# Patient Record
Sex: Female | Born: 1965 | Race: Black or African American | Hispanic: No | Marital: Single | Smoking: Never smoker
Health system: Southern US, Community
[De-identification: ages and names within clinical notes are randomized; demographics above are authoritative.]

## PROBLEM LIST (undated history)

## (undated) DIAGNOSIS — R42 Dizziness and giddiness: Secondary | ICD-10-CM

## (undated) DIAGNOSIS — I10 Essential (primary) hypertension: Secondary | ICD-10-CM

## (undated) HISTORY — PX: TUBAL LIGATION: SHX77

## (undated) HISTORY — PX: ABDOMINAL HYSTERECTOMY: SHX81

---

## 1999-06-22 ENCOUNTER — Emergency Department (HOSPITAL_COMMUNITY): Admission: EM | Admit: 1999-06-22 | Discharge: 1999-06-22 | Payer: Self-pay | Admitting: Emergency Medicine

## 1999-09-13 ENCOUNTER — Other Ambulatory Visit: Admission: RE | Admit: 1999-09-13 | Discharge: 1999-09-13 | Payer: Self-pay | Admitting: Obstetrics

## 2015-08-18 ENCOUNTER — Encounter (HOSPITAL_BASED_OUTPATIENT_CLINIC_OR_DEPARTMENT_OTHER): Payer: Self-pay | Admitting: Emergency Medicine

## 2015-08-18 ENCOUNTER — Emergency Department (HOSPITAL_BASED_OUTPATIENT_CLINIC_OR_DEPARTMENT_OTHER): Payer: Self-pay

## 2015-08-18 ENCOUNTER — Other Ambulatory Visit: Payer: Self-pay

## 2015-08-18 DIAGNOSIS — Z79899 Other long term (current) drug therapy: Secondary | ICD-10-CM | POA: Insufficient documentation

## 2015-08-18 DIAGNOSIS — R0789 Other chest pain: Secondary | ICD-10-CM | POA: Insufficient documentation

## 2015-08-18 DIAGNOSIS — J301 Allergic rhinitis due to pollen: Secondary | ICD-10-CM | POA: Insufficient documentation

## 2015-08-18 DIAGNOSIS — I10 Essential (primary) hypertension: Secondary | ICD-10-CM | POA: Insufficient documentation

## 2015-08-18 LAB — BASIC METABOLIC PANEL
ANION GAP: 8 (ref 5–15)
BUN: 19 mg/dL (ref 6–20)
CALCIUM: 9.2 mg/dL (ref 8.9–10.3)
CO2: 27 mmol/L (ref 22–32)
Chloride: 105 mmol/L (ref 101–111)
Creatinine, Ser: 0.77 mg/dL (ref 0.44–1.00)
Glucose, Bld: 106 mg/dL — ABNORMAL HIGH (ref 65–99)
Potassium: 3.4 mmol/L — ABNORMAL LOW (ref 3.5–5.1)
Sodium: 140 mmol/L (ref 135–145)

## 2015-08-18 LAB — CBC
HCT: 37.3 % (ref 36.0–46.0)
HEMOGLOBIN: 12.1 g/dL (ref 12.0–15.0)
MCH: 27.9 pg (ref 26.0–34.0)
MCHC: 32.4 g/dL (ref 30.0–36.0)
MCV: 85.9 fL (ref 78.0–100.0)
Platelets: 249 10*3/uL (ref 150–400)
RBC: 4.34 MIL/uL (ref 3.87–5.11)
RDW: 12.9 % (ref 11.5–15.5)
WBC: 4 10*3/uL (ref 4.0–10.5)

## 2015-08-18 NOTE — ED Notes (Signed)
Patient states that she has generalized chest pain to her right and left chest about 2 hours ago. She reports that it radiated to her neck and shoulders. Felt like it was sharp like pins and needles. Patient denies any SOB, or N/V or dizziness. Patient reports that the pain has now lessened from when it first happened and she now has a HA

## 2015-08-19 ENCOUNTER — Emergency Department (HOSPITAL_BASED_OUTPATIENT_CLINIC_OR_DEPARTMENT_OTHER)
Admission: EM | Admit: 2015-08-19 | Discharge: 2015-08-19 | Disposition: A | Discharge status: Home / Self Care | Payer: Self-pay | Attending: Emergency Medicine | Admitting: Emergency Medicine

## 2015-08-19 DIAGNOSIS — R0789 Other chest pain: Secondary | ICD-10-CM

## 2015-08-19 DIAGNOSIS — J302 Other seasonal allergic rhinitis: Secondary | ICD-10-CM

## 2015-08-19 HISTORY — DX: Dizziness and giddiness: R42

## 2015-08-19 HISTORY — DX: Essential (primary) hypertension: I10

## 2015-08-19 LAB — URINALYSIS, ROUTINE W REFLEX MICROSCOPIC
BILIRUBIN URINE: NEGATIVE
Glucose, UA: NEGATIVE mg/dL
Hgb urine dipstick: NEGATIVE
KETONES UR: NEGATIVE mg/dL
Leukocytes, UA: NEGATIVE
NITRITE: NEGATIVE
PH: 7 (ref 5.0–8.0)
PROTEIN: NEGATIVE mg/dL
Specific Gravity, Urine: 1.029 (ref 1.005–1.030)

## 2015-08-19 LAB — TROPONIN I

## 2015-08-19 MED ORDER — KETOROLAC TROMETHAMINE 30 MG/ML IJ SOLN
30.0000 mg | Freq: Once | INTRAMUSCULAR | Status: DC
  Filled 2015-08-19: qty 1

## 2015-08-19 MED ORDER — LORATADINE 10 MG PO TABS: 10.0000 mg | ORAL_TABLET | Freq: Every day | ORAL | Status: AC

## 2015-08-19 MED ORDER — NAPROXEN 500 MG PO TABS: 500.0000 mg | ORAL_TABLET | Freq: Two times a day (BID) | ORAL | Status: AC

## 2015-08-19 NOTE — ED Provider Notes (Signed)
CSN: [id number]     Arrival date & time 08/18/15  2200 History   First MD Initiated Contact with Patient 08/19/15 0143     Chief Complaint  Patient presents with  . Chest Pain     (Consider location/radiation/quality/duration/timing/severity/associated sxs/prior Treatment) HPI  This is a 50 year old female with a history of hypertension who presents with chest, neck, and shoulder pain. Patient had onset of symptoms one hour prior to arrival. She states that the pain was sharp and right-sided with a sensation of "pins and needles." Nothing seemed to make it better or worse. Denies associated shortness of breath, nausea, vomiting. No association with eating. 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 Currently she reports the pain is resolved. She does report headache which she states she sometimes gets and a one-month history of sore throat and cough. She reports itchy watery eyes and sneezing. History of seasonal allergies. No fevers.   Past Medical History  Diagnosis Date  . Vertigo   . Hypertension    Past Surgical History  Procedure Laterality Date  . Tubal ligation    . Abdominal hysterectomy     History reviewed. No pertinent family history. Social History  Substance Use Topics  . Smoking status: Never Smoker   . Smokeless tobacco: None  . Alcohol Use: Yes     Comment: occ   OB History    No data available     Review of Systems  Constitutional: Negative for fever.  HENT: Positive for sneezing and sore throat.   Respiratory: Negative for shortness of breath.   Cardiovascular: Positive for chest pain. Negative for leg swelling.  Gastrointestinal: Negative for nausea, vomiting, abdominal pain and diarrhea.  All other systems reviewed and are negative.     Allergies  Latex  Home Medications   Prior to Admission medications   Medication Sig Start  Date End Date Taking? Authorizing Provider  lisinopril-hydrochlorothiazide (PRINZIDE,ZESTORETIC) 10-12.5 MG tablet Take 1 tablet by mouth daily.   Yes Historical Provider, MD  loratadine (CLARITIN) 10 MG tablet Take 1 tablet (10 mg total) by mouth daily. 08/19/15   Shon Batonourtney F Horton, MD   BP 132/99 mmHg  Pulse 81  Temp(Src) 99.1 F (37.3 C) (Oral)  Resp 12  Ht 5' (1.524 m)  Wt 150 lb (68.04 kg)  BMI 29.30 kg/m2  SpO2 100% Physical Exam  Constitutional: She is oriented to person, place, and time. She appears well-developed and well-nourished. No distress.  HENT:  Head: Normocephalic and atraumatic.  Mouth/Throat: Oropharynx is clear and moist.  Postnasal drip noted  Eyes: Pupils are equal, round, and reactive to light.  Neck: Neck supple.  Cardiovascular: Normal rate, regular rhythm and normal heart sounds.   Pulmonary/Chest: Effort normal and breath sounds normal. No respiratory distress. She has no wheezes. She exhibits tenderness.  Tenderness palpation over the anterior chest wall diffusely  Abdominal: Soft. Bowel sounds are normal. There is no tenderness. There is no rebound.  Musculoskeletal: She exhibits no edema.  Neurological: She is alert and oriented to person, place, and time.  Skin: Skin is warm and dry.  Psychiatric: She has a normal mood and affect.  Nursing note and vitals reviewed.   ED Course  Procedures (including critical care time) Labs Review Labs Reviewed  BASIC METABOLIC PANEL - Abnormal; Notable for the following:    Potassium 3.4 (*)  Glucose, Bld 106 (*)    All other components within normal limits  CBC  TROPONIN I  URINALYSIS, ROUTINE W REFLEX MICROSCOPIC (NOT AT [facility])  TROPONIN I    Imaging Review Dg Chest 2 View  08/18/2015  CLINICAL DATA:  Generalized chest pain for 2 hours. EXAM: CHEST  2 VIEW COMPARISON:  None. FINDINGS: The cardiomediastinal contours are normal. The lungs are clear. Pulmonary vasculature is normal. No consolidation,  pleural effusion, or pneumothorax. No acute osseous abnormalities are seen. IMPRESSION: No acute pulmonary process. Electronically Signed   By: Rubye Oaks M.D.   On: 08/18/2015 22:47   I have personally reviewed and evaluated these images and lab results as part of my medical decision-making.   EKG Interpretation   Date/Time:  Saturday August 18 2015 22:08:44 EST Ventricular Rate:  100 PR Interval:  162 QRS Duration: 80 QT Interval:  388 QTC Calculation: 500 R Axis:   31 Text Interpretation:  Normal sinus rhythm Possible Left atrial enlargement  Prolonged QT Abnormal ECG Confirmed by HORTON  MD, COURTNEY ([id number]) on  08/19/2015 1:11:56 AM      MDM   Final diagnoses:  Other chest pain  Seasonal allergies    Patient presents with chest pain. Nontoxic-appearing. Vital signs reassuring.  Pain is atypical for ACS. EKG is reassuring. Chest x-ray is negative. Troponin 2 is negative. Patient is relatively low risk for ACS with only risk factor of hypertension. Chest pain is reproducible on exam. 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 Patient also reports cough and sore throat. This appears more chronic in nature. Her physical exam is unremarkable. Suspect seasonal allergies. Discussed with patient follow-up with cardiology when necessary for evaluation for possible stress testing. Will discharge home with Claritin and anti-inflammatory medications.  After history, exam, and medical workup I feel the patient has been appropriately medically screened and is safe for discharge home. Pertinent diagnoses were discussed with the patient. Patient was given return precautions.   Shon Baton, MD 08/19/15 [id number]

## 2015-08-19 NOTE — ED Notes (Signed)
Attempted to put pt on monitor, but refused at the time because she had to go to the bathroom.

## 2015-08-19 NOTE — Discharge Instructions (Signed)
Allergies An allergy is an abnormal reaction to a substance by the body's defense system (immune system). Allergies can develop at any age. WHAT CAUSES ALLERGIES? An allergic reaction happens when the immune system mistakenly reacts to a normally harmless substance, called an allergen, as if it were harmful. The immune system releases antibodies to fight the substance. Antibodies eventually release a chemical called histamine into the bloodstream. The release of histamine is meant to protect the body from infection, but it also causes discomfort. An allergic reaction can be triggered by:  Eating an allergen.  Inhaling an allergen.  Touching an allergen. WHAT TYPES OF ALLERGIES ARE THERE? There are many types of allergies. Common types include:  Seasonal allergies. People with this type of allergy are usually allergic to substances that are only present during certain seasons, such as molds and pollens.  Food allergies.  Drug allergies.  Insect allergies.  Animal dander allergies. WHAT ARE SYMPTOMS OF ALLERGIES? Possible allergy symptoms include:  Swelling of the lips, face, tongue, mouth, or throat.  Sneezing, coughing, or wheezing.  Nasal congestion.  Tingling in the mouth.  Rash.  Itching.  Itchy, red, swollen areas of skin (hives).  Watery eyes.  Vomiting.  Diarrhea.  Dizziness.  Lightheadedness.  Fainting.  Trouble breathing or swallowing.  Chest tightness.  Rapid heartbeat. HOW ARE ALLERGIES DIAGNOSED? Allergies are diagnosed with a medical and family history and one or more of the following:  Skin tests.  Blood tests.  A food diary. A food diary is a record of all the foods and drinks you have in a day and of all the symptoms you experience.  The results of an elimination diet. An elimination diet involves eliminating foods from your diet and then adding them back in one by one to find out if a certain food causes an allergic reaction. HOW ARE  ALLERGIES TREATED? There is no cure for allergies, but allergic reactions can be treated with medicine. Severe reactions usually need to be treated at a hospital. HOW CAN REACTIONS BE PREVENTED? The best way to prevent an allergic reaction is by avoiding the substance you are allergic to. Allergy shots and medicines can also help prevent reactions in some cases. People with severe allergic reactions may be able to prevent a life-threatening reaction called anaphylaxis with a medicine given right after exposure to the allergen.   This information is not intended to replace advice given to you by your health care provider. Make sure you discuss any questions you have with your health care provider.   Document Released: 08/19/2002 Document Revised: 06/16/2014 Document Reviewed: 03/07/2014 Elsevier Interactive Patient Education 2016 Elsevier Inc.  Chest Wall Pain Chest wall pain is pain in or around the bones and muscles of your chest. Sometimes, an injury causes this pain. Sometimes, the cause may not be known. This pain may take several weeks or longer to get better. HOME CARE INSTRUCTIONS  Pay attention to any changes in your symptoms. Take these actions to help with your pain:   Rest as told by your health care provider.   Avoid activities that cause pain. These include any activities that use your chest muscles or your abdominal and side muscles to lift heavy items.   If directed, apply ice to the painful area:  Put ice in a plastic bag.  Place a towel between your skin and the bag.  Leave the ice on for 20 minutes, 2-3 times per day.  Take over-the-counter and prescription medicines only as told  by your health care provider.  Do not use tobacco products, including cigarettes, chewing tobacco, and e-cigarettes. If you need help quitting, ask your health care provider.  Keep all follow-up visits as told by your health care provider. This is important. SEEK MEDICAL CARE IF:  You  have a fever.  Your chest pain becomes worse.  You have new symptoms. SEEK IMMEDIATE MEDICAL CARE IF:  You have nausea or vomiting.  You feel sweaty or light-headed.  You have a cough with phlegm (sputum) or you cough up blood.  You develop shortness of breath.   This information is not intended to replace advice given to you by your health care provider. Make sure you discuss any questions you have with your health care provider.   Document Released: 05/26/2005 Document Revised: 02/14/2015 Document Reviewed: 08/21/2014 Elsevier Interactive Patient Education [employer] Inc.

## 2017-11-03 IMAGING — CR DG CHEST 2V
2 series · 2 of 2 positions shown · non-contrast
Comparison: None.

CLINICAL DATA: Generalized chest pain for 2 hours.

EXAM:
CHEST  2 VIEW

[w chest pa]
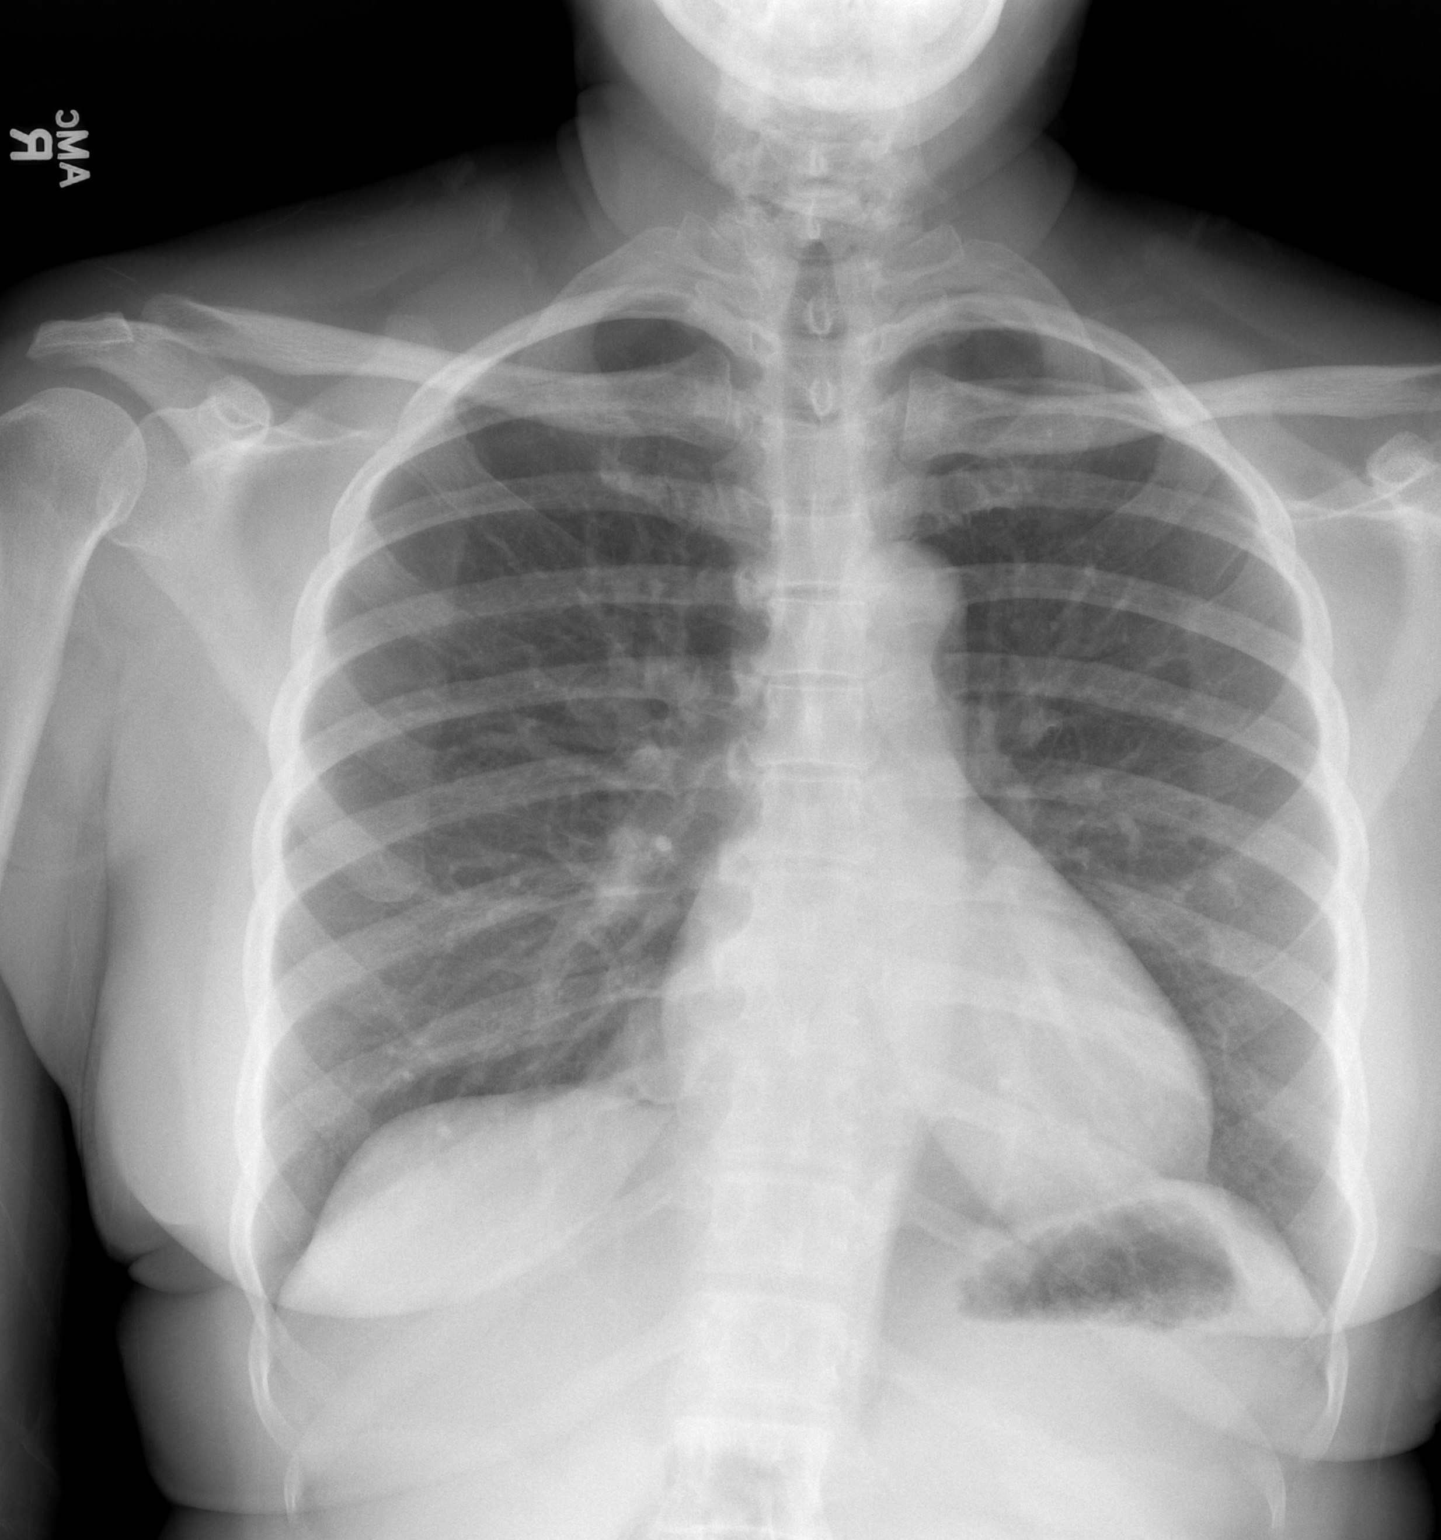

[w chest lat]
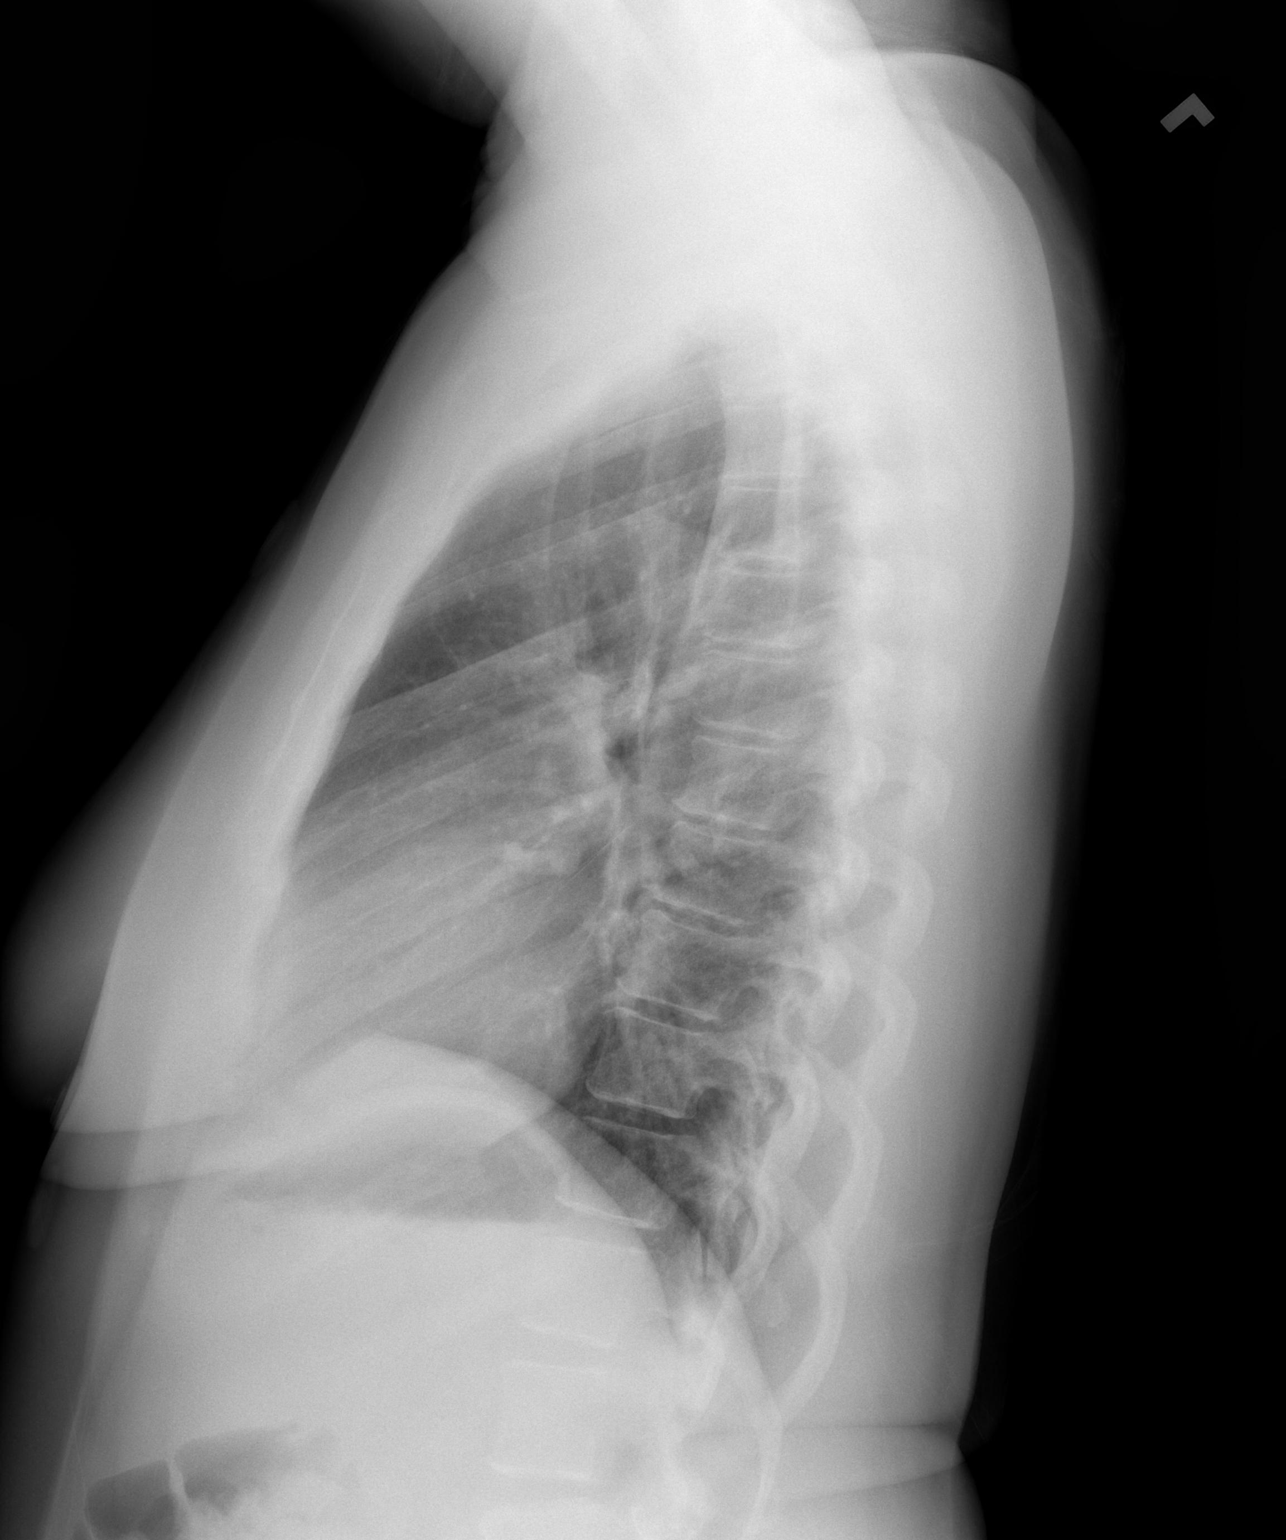

[2 of 2 positions shown; findings below may reference images not displayed]

FINDINGS: The cardiomediastinal contours are normal. The lungs are clear.
Pulmonary vasculature is normal. No consolidation, pleural effusion,
or pneumothorax. No acute osseous abnormalities are seen.
IMPRESSION: No acute pulmonary process.

## 2019-12-21 ENCOUNTER — Other Ambulatory Visit: Payer: Self-pay | Admitting: *Deleted

## 2019-12-21 DIAGNOSIS — Z1231 Encounter for screening mammogram for malignant neoplasm of breast: Secondary | ICD-10-CM

## 2020-01-24 ENCOUNTER — Ambulatory Visit: Payer: Self-pay

## 2022-08-10 ENCOUNTER — Emergency Department (HOSPITAL_BASED_OUTPATIENT_CLINIC_OR_DEPARTMENT_OTHER): Payer: Self-pay

## 2022-08-10 ENCOUNTER — Encounter (HOSPITAL_BASED_OUTPATIENT_CLINIC_OR_DEPARTMENT_OTHER): Payer: Self-pay | Admitting: Emergency Medicine

## 2022-08-10 ENCOUNTER — Emergency Department (HOSPITAL_BASED_OUTPATIENT_CLINIC_OR_DEPARTMENT_OTHER)
Admission: EM | Admit: 2022-08-10 | Discharge: 2022-08-10 | Disposition: A | Discharge status: Home / Self Care | Payer: Self-pay | Attending: Emergency Medicine | Admitting: Emergency Medicine

## 2022-08-10 ENCOUNTER — Other Ambulatory Visit: Payer: Self-pay

## 2022-08-10 DIAGNOSIS — R002 Palpitations: Secondary | ICD-10-CM | POA: Insufficient documentation

## 2022-08-10 DIAGNOSIS — R079 Chest pain, unspecified: Secondary | ICD-10-CM | POA: Insufficient documentation

## 2022-08-10 DIAGNOSIS — I10 Essential (primary) hypertension: Secondary | ICD-10-CM | POA: Insufficient documentation

## 2022-08-10 DIAGNOSIS — Z91040 Latex allergy status: Secondary | ICD-10-CM | POA: Insufficient documentation

## 2022-08-10 DIAGNOSIS — Z79899 Other long term (current) drug therapy: Secondary | ICD-10-CM | POA: Insufficient documentation

## 2022-08-10 LAB — BASIC METABOLIC PANEL
Anion gap: 6 (ref 5–15)
BUN: 14 mg/dL (ref 6–20)
CO2: 30 mmol/L (ref 22–32)
Calcium: 9.3 mg/dL (ref 8.9–10.3)
Chloride: 102 mmol/L (ref 98–111)
Creatinine, Ser: 0.75 mg/dL (ref 0.44–1.00)
GFR, Estimated: >60 mL/min (ref >60)
Glucose, Bld: 107 mg/dL — ABNORMAL HIGH (ref 70–99)
Potassium: 3.6 mmol/L (ref 3.5–5.1)
Sodium: 138 mmol/L (ref 135–145)

## 2022-08-10 LAB — CBC
HCT: 40.2 % (ref 36.0–46.0)
Hemoglobin: 12.8 g/dL (ref 12.0–15.0)
MCH: 27.4 pg (ref 26.0–34.0)
MCHC: 31.8 g/dL (ref 30.0–36.0)
MCV: 86.1 fL (ref 80.0–100.0)
Platelets: 285 10*3/uL (ref 150–400)
RBC: 4.67 MIL/uL (ref 3.87–5.11)
RDW: 12.8 % (ref 11.5–15.5)
WBC: 4.7 10*3/uL (ref 4.0–10.5)
nRBC: 0 % (ref 0.0–0.2)

## 2022-08-10 LAB — D-DIMER, QUANTITATIVE: D-Dimer, Quant: <0.27 ug/mL-FEU (ref 0.00–0.50)

## 2022-08-10 LAB — PREGNANCY, URINE: Preg Test, Ur: NEGATIVE

## 2022-08-10 LAB — T4, FREE: Free T4: 0.78 ng/dL (ref 0.61–1.12)

## 2022-08-10 LAB — TROPONIN I (HIGH SENSITIVITY)
Troponin I (High Sensitivity): 2 ng/L (ref <18)
Troponin I (High Sensitivity): <2 ng/L (ref <18)

## 2022-08-10 LAB — TSH: TSH: 0.637 u[IU]/mL (ref 0.350–4.500)

## 2022-08-10 NOTE — Discharge Instructions (Addendum)
Please follow-up with your primary care doctor, call them tomorrow, and request that you get a heart monitor outpatient to evaluate if you are having arrhythmia patient.  He had any severe chest pain, shortness of breath, weakness on one side of the body, facial droop please return to the ER.

## 2022-08-10 NOTE — ED Triage Notes (Signed)
Pt with chest pressure and heart racing; has had 2 episodes of this in the last 2 wks; sts it happens when she is being active (cleaning, etc); NAD at this time

## 2022-08-10 NOTE — ED Notes (Signed)
ED Provider at bedside. 

## 2022-08-10 NOTE — ED Provider Notes (Signed)
[facility] Provider Note   CSN: [id number] Arrival date & time: 08/10/22  1300     History  Chief Complaint  Patient presents with   Chest Pain    Courtney Campbell is a 57 y.o. female, history of hypertension, who presents to the ED secondary to chest tightness, shortness of breath, that has been episodic going on for the last 2 weeks.  She states 2 to weeks ago, she noticed that she was getting her scrub jacket on, when she all of a sudden had fluttering of her chest, felt like it was beating out of her chest, and some chest pressure, this lasted for about 5 minutes, and then resolved, was associated with shortness of breath.  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 as she was walking from the bathroom, and noticed the same sensation.  Denies any radiation of the pain, no nausea, vomiting, abdominal pain.  Denies any recent trauma.     Home Medications Prior to Admission medications   Medication Sig Start Date End Date Taking? Authorizing Provider  lisinopril-hydrochlorothiazide (PRINZIDE,ZESTORETIC) 10-12.5 MG tablet Take 1 tablet by mouth daily.    [provider]  loratadine (CLARITIN) 10 MG tablet Take 1 tablet (10 mg total) by mouth daily. 08/19/15   Horton, Barbette Hair, MD  naproxen (NAPROSYN) 500 MG tablet Take 1 tablet (500 mg total) by mouth 2 (two) times daily. 08/19/15   Horton, Barbette Hair, MD      Allergies    Latex    Review of Systems   Review of Systems  Respiratory:  Positive for shortness of breath.   Cardiovascular:  Positive for chest pain.    Physical Exam Updated Vital Signs BP 132/87   Pulse 73   Temp 98.6 F (37 C) (Oral)   Resp 19   Ht 5' (1.524 m)   Wt 65.8 kg   SpO2 100%   BMI 28.32 kg/m  Physical Exam Vitals and nursing note reviewed.   Constitutional:      General: She is not in acute distress.    Appearance: She is well-developed.  HENT:     Head: Normocephalic and atraumatic.  Eyes:     Conjunctiva/sclera: Conjunctivae normal.  Cardiovascular:     Rate and Rhythm: Normal rate and regular rhythm.     Heart sounds: No murmur heard. Pulmonary:     Effort: Pulmonary effort is normal. No respiratory distress.     Breath sounds: Normal breath sounds.  Abdominal:     Palpations: Abdomen is soft.     Tenderness: There is no abdominal tenderness.  Musculoskeletal:        General: No swelling.     Cervical back: Neck supple.  Skin:    General: Skin is warm and dry.     Capillary Refill: Capillary refill takes less than 2 seconds.  Neurological:     Mental Status: She is alert.  Psychiatric:        Mood and Affect: Mood normal.     ED Results / Procedures / Treatments   Labs (all labs ordered are listed, but only abnormal results are displayed) Labs Reviewed  BASIC METABOLIC PANEL - Abnormal; Notable for the following components:      Result Value   Glucose, Bld 107 (*)  All other components within normal limits  CBC  PREGNANCY, URINE  D-DIMER, QUANTITATIVE  TSH  T4, FREE  TROPONIN I (HIGH SENSITIVITY)  TROPONIN I (HIGH SENSITIVITY)    EKG EKG Interpretation  Date/Time:  Sunday August 10 2022 13:14:44 EST Ventricular Rate:  86 PR Interval:  185 QRS Duration: 82 QT Interval:  398 QTC Calculation: 476 R Axis:   58 Text Interpretation: Sinus rhythm Right atrial enlargement Borderline T abnormalities, anterior leads Confirmed by Lennice Sites ([id number]) on 08/10/2022 3:25:57 PM  Radiology DG Chest 2 View  Result Date: 08/10/2022 CLINICAL DATA:  Chest pressure and palpitations. EXAM: CHEST - 2 VIEW COMPARISON:  Chest x-ray dated August 18, 2015. FINDINGS: The heart size and mediastinal contours are within normal limits. Both lungs are clear. The visualized skeletal structures are unremarkable. IMPRESSION:  No active cardiopulmonary disease. Electronically Signed   By: Titus Dubin M.D.   On: 08/10/2022 13:32    Procedures Procedures   Medications Ordered in ED Medications - No data to display  ED Course/ Medical Decision Making/ A&P             HEART Score: 4                Medical Decision Making Patient is a 57 year old female, here for chest pain, and shortness of breath has happened 3 times in the last couple weeks, she is concerned that there is nothing wrong with her heart so she came to the ER.  She denies any nausea, vomiting, radiation of the pain.  We will obtain CBC, CMP, troponins, D-dimer, TSH, for evaluation for the palpitations.  Amount and/or Complexity of Data Reviewed Labs: ordered.    Details: Normal troponin, D-dimer less than 0.27 Radiology: ordered.    Details: Chest x-ray clear Discussion of management or test interpretation with external provider(s): Yes with patient, I am concerned that she is having palpitations that may be secondary to an arrhythmia, I encouraged her to follow-up with her primary care tock doctor tomorrow, and call them and ask for a outpatient heart monitor, she will likely be monitored for 14 to 30 days to catch the arrhythmia.  We discussed emergent return symptoms such as weakness on one side of the body, worsening chest pain, or shortness of breath.  She is currently not in an arrhythmia at this time.  We discussed also that I have sent a TSH and T4 to find of a possible cause for her palpitations, and that she will need to follow-up with her primary care doctor with this.  I discussed return precautions she voiced understanding.   Final Clinical Impression(s) / ED Diagnoses Final diagnoses:  Palpitations  Chest pain, unspecified type    Rx / DC Orders ED Discharge Orders     None         Amit Meloy, Si Gaul, PA 08/10/22 1907    Lennice Sites, DO 08/10/22 1915
